# Patient Record
Sex: Male | Born: 1945 | Race: White | Hispanic: No | Marital: Married | Smoking: Never smoker
Health system: Southern US, Community
[De-identification: ages and names within clinical notes are randomized; demographics above are authoritative.]

## PROBLEM LIST (undated history)

## (undated) DIAGNOSIS — I714 Abdominal aortic aneurysm, without rupture, unspecified: Secondary | ICD-10-CM

## (undated) DIAGNOSIS — I341 Nonrheumatic mitral (valve) prolapse: Secondary | ICD-10-CM

## (undated) DIAGNOSIS — I219 Acute myocardial infarction, unspecified: Secondary | ICD-10-CM

## (undated) DIAGNOSIS — K589 Irritable bowel syndrome without diarrhea: Secondary | ICD-10-CM

## (undated) DIAGNOSIS — G4730 Sleep apnea, unspecified: Secondary | ICD-10-CM

## (undated) DIAGNOSIS — I10 Essential (primary) hypertension: Secondary | ICD-10-CM

## (undated) DIAGNOSIS — E7800 Pure hypercholesterolemia, unspecified: Secondary | ICD-10-CM

## (undated) HISTORY — PX: ANKLE SURGERY: SHX546

## (undated) HISTORY — PX: CORONARY ANGIOPLASTY WITH STENT PLACEMENT: SHX49

## (undated) HISTORY — PX: BACK SURGERY: SHX140

## (undated) HISTORY — PX: HERNIA REPAIR: SHX51

---

## 2009-05-06 ENCOUNTER — Emergency Department (HOSPITAL_BASED_OUTPATIENT_CLINIC_OR_DEPARTMENT_OTHER): Admission: EM | Admit: 2009-05-06 | Discharge: 2009-05-06 | Payer: Self-pay | Admitting: Emergency Medicine

## 2009-05-06 ENCOUNTER — Ambulatory Visit: Payer: Self-pay | Admitting: Diagnostic Radiology

## 2009-10-28 ENCOUNTER — Emergency Department (HOSPITAL_BASED_OUTPATIENT_CLINIC_OR_DEPARTMENT_OTHER): Admission: EM | Admit: 2009-10-28 | Discharge: 2009-10-28 | Payer: Self-pay | Admitting: Emergency Medicine

## 2011-03-20 LAB — URINALYSIS, ROUTINE W REFLEX MICROSCOPIC
Bilirubin Urine: NEGATIVE
Nitrite: NEGATIVE
Specific Gravity, Urine: 1.008 (ref 1.005–1.030)
Urobilinogen, UA: 1 mg/dL (ref 0.0–1.0)

## 2011-03-26 LAB — COMPREHENSIVE METABOLIC PANEL
ALT: 28 U/L (ref 0–53)
AST: 29 U/L (ref 0–37)
Calcium: 9.2 mg/dL (ref 8.4–10.5)
GFR calc Af Amer: >60 mL/min (ref >60)
Potassium: 3.7 mEq/L (ref 3.5–5.1)
Sodium: 140 mEq/L (ref 135–145)
Total Protein: 7.3 g/dL (ref 6.0–8.3)

## 2011-03-26 LAB — URINALYSIS, ROUTINE W REFLEX MICROSCOPIC
Glucose, UA: NEGATIVE mg/dL
Hgb urine dipstick: NEGATIVE
Specific Gravity, Urine: 1.005 (ref 1.005–1.030)
Urobilinogen, UA: 0.2 mg/dL (ref 0.0–1.0)
pH: 6 (ref 5.0–8.0)

## 2011-03-26 LAB — POCT CARDIAC MARKERS
Myoglobin, poc: 67.6 ng/mL (ref 12–200)
Troponin i, poc: <0.05 ng/mL (ref 0.00–0.09)

## 2011-03-26 LAB — CBC
MCHC: 33.5 g/dL (ref 30.0–36.0)
RBC: 5.61 MIL/uL (ref 4.22–5.81)
RDW: 12 % (ref 11.5–15.5)

## 2011-03-26 LAB — DIFFERENTIAL
Eosinophils Absolute: 0.2 10*3/uL (ref 0.0–0.7)
Eosinophils Relative: 4 % (ref 0–5)
Lymphs Abs: 1.8 10*3/uL (ref 0.7–4.0)
Monocytes Absolute: 0.5 10*3/uL (ref 0.1–1.0)
Monocytes Relative: 8 % (ref 3–12)

## 2012-09-23 ENCOUNTER — Ambulatory Visit (INDEPENDENT_AMBULATORY_CARE_PROVIDER_SITE_OTHER): Payer: Self-pay

## 2012-09-23 DIAGNOSIS — R197 Diarrhea, unspecified: Secondary | ICD-10-CM

## 2012-10-01 ENCOUNTER — Other Ambulatory Visit: Payer: Self-pay

## 2012-10-01 ENCOUNTER — Ambulatory Visit (INDEPENDENT_AMBULATORY_CARE_PROVIDER_SITE_OTHER): Payer: Self-pay

## 2012-10-01 DIAGNOSIS — R197 Diarrhea, unspecified: Secondary | ICD-10-CM

## 2012-10-19 ENCOUNTER — Ambulatory Visit: Payer: Self-pay

## 2012-11-30 ENCOUNTER — Ambulatory Visit (INDEPENDENT_AMBULATORY_CARE_PROVIDER_SITE_OTHER): Payer: Self-pay

## 2012-11-30 DIAGNOSIS — R197 Diarrhea, unspecified: Secondary | ICD-10-CM

## 2012-12-17 ENCOUNTER — Ambulatory Visit: Payer: Self-pay

## 2013-02-22 ENCOUNTER — Ambulatory Visit (INDEPENDENT_AMBULATORY_CARE_PROVIDER_SITE_OTHER): Payer: Self-pay

## 2013-03-09 ENCOUNTER — Telehealth: Payer: Self-pay | Admitting: Gastroenterology

## 2013-03-09 NOTE — Telephone Encounter (Signed)
Rec'd from [facility] forward 15 pages to Dr. Arlyce Dice 03/09/13

## 2013-03-10 ENCOUNTER — Ambulatory Visit (INDEPENDENT_AMBULATORY_CARE_PROVIDER_SITE_OTHER): Payer: Self-pay

## 2013-03-10 DIAGNOSIS — R197 Diarrhea, unspecified: Secondary | ICD-10-CM

## 2013-05-05 ENCOUNTER — Ambulatory Visit (INDEPENDENT_AMBULATORY_CARE_PROVIDER_SITE_OTHER): Payer: Self-pay

## 2013-05-05 DIAGNOSIS — R197 Diarrhea, unspecified: Secondary | ICD-10-CM

## 2015-12-25 ENCOUNTER — Ambulatory Visit: Payer: Self-pay | Admitting: Internal Medicine

## 2016-01-12 ENCOUNTER — Ambulatory Visit: Payer: Self-pay | Admitting: Internal Medicine

## 2018-02-15 ENCOUNTER — Other Ambulatory Visit: Payer: Self-pay

## 2018-02-15 ENCOUNTER — Encounter (HOSPITAL_BASED_OUTPATIENT_CLINIC_OR_DEPARTMENT_OTHER): Payer: Self-pay | Admitting: *Deleted

## 2018-02-15 ENCOUNTER — Emergency Department (HOSPITAL_BASED_OUTPATIENT_CLINIC_OR_DEPARTMENT_OTHER)
Admission: EM | Admit: 2018-02-15 | Discharge: 2018-02-15 | Disposition: A | Discharge status: Home / Self Care | Payer: Medicare Other | Attending: Emergency Medicine | Admitting: Emergency Medicine

## 2018-02-15 ENCOUNTER — Emergency Department (HOSPITAL_BASED_OUTPATIENT_CLINIC_OR_DEPARTMENT_OTHER): Payer: Medicare Other

## 2018-02-15 DIAGNOSIS — Z7902 Long term (current) use of antithrombotics/antiplatelets: Secondary | ICD-10-CM | POA: Diagnosis not present

## 2018-02-15 DIAGNOSIS — Z79899 Other long term (current) drug therapy: Secondary | ICD-10-CM | POA: Diagnosis not present

## 2018-02-15 DIAGNOSIS — I252 Old myocardial infarction: Secondary | ICD-10-CM | POA: Insufficient documentation

## 2018-02-15 DIAGNOSIS — R0602 Shortness of breath: Secondary | ICD-10-CM | POA: Diagnosis present

## 2018-02-15 DIAGNOSIS — I509 Heart failure, unspecified: Secondary | ICD-10-CM | POA: Diagnosis not present

## 2018-02-15 DIAGNOSIS — I110 Hypertensive heart disease with heart failure: Secondary | ICD-10-CM | POA: Insufficient documentation

## 2018-02-15 HISTORY — DX: Sleep apnea, unspecified: G47.30

## 2018-02-15 HISTORY — DX: Irritable bowel syndrome without diarrhea: K58.9

## 2018-02-15 HISTORY — DX: Abdominal aortic aneurysm, without rupture: I71.4

## 2018-02-15 HISTORY — DX: Essential (primary) hypertension: I10

## 2018-02-15 HISTORY — DX: Nonrheumatic mitral (valve) prolapse: I34.1

## 2018-02-15 HISTORY — DX: Pure hypercholesterolemia, unspecified: E78.00

## 2018-02-15 HISTORY — DX: Abdominal aortic aneurysm, without rupture, unspecified: I71.40

## 2018-02-15 HISTORY — DX: Acute myocardial infarction, unspecified: I21.9

## 2018-02-15 LAB — BASIC METABOLIC PANEL
Anion gap: 7 (ref 5–15)
BUN: 16 mg/dL (ref 6–20)
CHLORIDE: 108 mmol/L (ref 101–111)
CO2: 26 mmol/L (ref 22–32)
Calcium: 8.8 mg/dL — ABNORMAL LOW (ref 8.9–10.3)
Creatinine, Ser: 0.77 mg/dL (ref 0.61–1.24)
GFR calc Af Amer: >60 mL/min (ref >60)
Glucose, Bld: 119 mg/dL — ABNORMAL HIGH (ref 65–99)
Potassium: 4 mmol/L (ref 3.5–5.1)
Sodium: 141 mmol/L (ref 135–145)

## 2018-02-15 LAB — CBC
HEMATOCRIT: 41.7 % (ref 39.0–52.0)
HEMOGLOBIN: 14.1 g/dL (ref 13.0–17.0)
MCH: 29 pg (ref 26.0–34.0)
MCHC: 33.8 g/dL (ref 30.0–36.0)
MCV: 85.6 fL (ref 78.0–100.0)
Platelets: 209 10*3/uL (ref 150–400)
RBC: 4.87 MIL/uL (ref 4.22–5.81)
RDW: 13.8 % (ref 11.5–15.5)
WBC: 7 10*3/uL (ref 4.0–10.5)

## 2018-02-15 LAB — TROPONIN I: Troponin I: <0.03 ng/mL (ref <0.03)

## 2018-02-15 LAB — BRAIN NATRIURETIC PEPTIDE: B NATRIURETIC PEPTIDE 5: 851 pg/mL — AB (ref 0.0–100.0)

## 2018-02-15 MED ORDER — FUROSEMIDE 10 MG/ML IJ SOLN
20.0000 mg | Freq: Once | INTRAMUSCULAR | Status: AC
  Administered 2018-02-15: 20 mg via INTRAVENOUS
  Filled 2018-02-15: qty 2

## 2018-02-15 MED ORDER — FUROSEMIDE 20 MG PO TABS: 20.0000 mg | ORAL_TABLET | Freq: Every day | ORAL | 0 refills | Status: AC

## 2018-02-15 NOTE — ED Notes (Signed)
Ambulated on r/a SpO2 94-97%, HR 66-80, RR 16-22. Denies DOE

## 2018-02-15 NOTE — ED Provider Notes (Signed)
[facility] EMERGENCY DEPARTMENT Provider Note   CSN: [id number] Arrival date & time: 02/15/18  0447     History   Chief Complaint Chief Complaint  Patient presents with  . Shortness of Breath    HPI Juan Singh is a 72 y.o. male.  HPI Patient presents to the emergency room for evaluation of shortness of breath.  Patient states he has a history of coronary artery disease.  He had stents placed in December.  Ever since then he has been feeling short of breath primarily at night although it is not associated with lying flat.  Patient states he saw his doctor at some point he adjusted his blood pressure medications . he has another appointment this week.  He denies any trouble with chest pain.  No leg swelling.  No fevers.  At times he feels like he is wheezing.  He did not sleep well last night because he felt like he had to remember to breathe so he came into the emergency room to be evaluated Past Medical History:  Diagnosis Date  . AAA (abdominal aortic aneurysm) (HCC)   . High cholesterol   . Hypertension   . Irritable bowel syndrome (IBS)   . Mitral valve prolapse   . Myocardial infarction (HCC)   . Sleep apnea     There are no active problems to display for this patient.   Past Surgical History:  Procedure Laterality Date  . ANKLE SURGERY    . BACK SURGERY    . CORONARY ANGIOPLASTY WITH STENT PLACEMENT    . HERNIA REPAIR         Home Medications    Prior to Admission medications   Medication Sig Start Date End Date Taking? Authorizing Provider  atorvastatin (LIPITOR) 40 MG tablet Take 40 mg by mouth daily.   Yes [provider]  clopidogrel (PLAVIX) 75 MG tablet Take 75 mg by mouth daily.   Yes [provider]  metoprolol succinate (TOPROL-XL) 25 MG 24 hr tablet Take 25 mg by mouth daily.   Yes [provider]  nitroGLYCERIN (NITROSTAT) 0.4 MG SL tablet Place 0.4 mg under the tongue every 5 (five) minutes as needed for  chest pain.   Yes [provider]  furosemide (LASIX) 20 MG tablet Take 1 tablet (20 mg total) by mouth daily. 02/15/18   Linwood Dibbles, MD    Family History No family history on file.  Social History Social History   Tobacco Use  . Smoking status: Never Smoker  . Smokeless tobacco: Never Used  Substance Use Topics  . Alcohol use: No    Frequency: Never  . Drug use: No     Allergies   Codeine   Review of Systems Review of Systems  All other systems reviewed and are negative.    Physical Exam Updated Vital Signs BP (!) 170/103 (BP Location: Left Arm)   Pulse (!) 59   Temp 98 F (36.7 C) (Oral)   Resp 19   Ht 1.803 m (5\' 11" )   Wt 117.9 kg (260 lb)   SpO2 94%   BMI 36.26 kg/m   Physical Exam  Constitutional: He appears well-developed and well-nourished. No distress.  HENT:  Head: Normocephalic and atraumatic.  Right Ear: External ear normal.  Left Ear: External ear normal.  Eyes: Conjunctivae are normal. Right eye exhibits no discharge. Left eye exhibits no discharge. No scleral icterus.  Neck: Neck supple. No tracheal deviation present.  Cardiovascular: Normal rate, regular  rhythm and intact distal pulses.  Pulmonary/Chest: Effort normal and breath sounds normal. No stridor. No respiratory distress. He has no wheezes. He has no rales.  Breathing comfortably, speaking in full sentences  Abdominal: Soft. Bowel sounds are normal. He exhibits no distension. There is no tenderness. There is no rebound and no guarding.  Musculoskeletal: He exhibits no edema or tenderness.  Neurological: He is alert. He has normal strength. No cranial nerve deficit (no facial droop, extraocular movements intact, no slurred speech) or sensory deficit. He exhibits normal muscle tone. He displays no seizure activity. Coordination normal.  Skin: Skin is warm and dry. No rash noted.  Psychiatric: He has a normal mood and affect.  Nursing note and vitals reviewed.    ED Treatments  / Results  Labs (all labs ordered are listed, but only abnormal results are displayed) Labs Reviewed  BASIC METABOLIC PANEL - Abnormal; Notable for the following components:      Result Value   Glucose, Bld 119 (*)    Calcium 8.8 (*)    All other components within normal limits  BRAIN NATRIURETIC PEPTIDE - Abnormal; Notable for the following components:   B Natriuretic Peptide 851.0 (*)    All other components within normal limits  CBC  TROPONIN I    EKG  EKG Interpretation  Date/Time:  Sunday February 15 2018 05:12:59 EST Ventricular Rate:  60 PR Interval:    QRS Duration: 125 QT Interval:  435 QTC Calculation: 435 R Axis:   68 Text Interpretation:  Sinus rhythm Nonspecific intraventricular conduction delay Anterior infarct, old r wave progression normalized since last tracing Confirmed by Linwood Dibbles [phone]) on 02/15/2018 5:29:01 AM       Radiology Dg Chest 2 View  Result Date: 02/15/2018 CLINICAL DATA:  Shortness of breath. EXAM: CHEST  2 VIEW COMPARISON:  Radiographs and CT 12/06/2017 FINDINGS: The heart is enlarged, similar to prior exam. Pulmonary edema is new. Small bilateral pleural effusions and fluid in the fissures. Streaky bibasilar opacities may be atelectasis or basilar pulmonary edema. IMPRESSION: CHF with cardiomegaly, pulmonary edema and small pleural effusions. Streaky infrahilar opacities may be more confluent pulmonary edema or atelectasis. Electronically Signed   By: Rubye Oaks M.D.   On: 02/15/2018 05:45    Procedures Procedures (including critical care time)  Medications Ordered in ED Medications  furosemide (LASIX) injection 20 mg (not administered)     Initial Impression / Assessment and Plan / ED Course  I have reviewed the triage vital signs and the nursing notes.  Pertinent labs & imaging results that were available during my care of the patient were reviewed by me and considered in my medical decision making (see chart for  details).  Clinical Course as of Feb 15 622  Wynelle Link Feb 15, 2018  0621 Laboratory tests are notable for an elevated BNP.  Chest x-ray suggests pulmonary edema and cardiomegaly  [JK]    Clinical Course User Index [JK] Linwood Dibbles, MD  Patient presents to the emergency room for evaluation of several months of shortness of breath following a myocardial infarction.  Patient is ED workup suggests congestive heart failure.  Patient is breathing easily in the emergency room.  He is got a normal oxygen saturation.  I think we could consider outpatient treatment considering the chronicity.  I will give the patient a dose of Lasix here in the emergency room and see if he notices any improvement.  We will try walking around and make sure he feels stable  for discharge.  Patient will need close follow-up with his cardiologist earlier this week.  Final Clinical Impressions(s) / ED Diagnoses   Final diagnoses:  Acute congestive heart failure, unspecified heart failure type [facility](HCC)    ED Discharge Orders        Ordered    furosemide (LASIX) 20 MG tablet  Daily     02/15/18 0620       Linwood DibblesKnapp, Eri Platten, MD 02/15/18 [phone]

## 2018-02-15 NOTE — ED Notes (Signed)
To x-ray

## 2018-02-15 NOTE — ED Notes (Signed)
Pt on monitor 

## 2018-02-15 NOTE — ED Notes (Signed)
ED Provider at bedside. 

## 2018-02-15 NOTE — ED Notes (Signed)
Returned from xray

## 2018-02-15 NOTE — ED Provider Notes (Signed)
PT feeling much better after diuresing after lasix.  Denies any CP or SOB.  Able to ambulate without SOB or hypoxia.  Will have close f/u with his cardiologist.  Given strict return precautions.  rx and discharge papers given per Dr. Lynelle DoctorKnapp.   Rolan BuccoBelfi, Marquesa Rath, MD 02/15/18 [phone]

## 2018-02-15 NOTE — ED Notes (Signed)
MD with pt  

## 2018-02-15 NOTE — ED Triage Notes (Addendum)
C/o feeling sob that started a couple months ago after he had stents placed but worse since last night. Has been congested. Denies any fevers. Denies any chest pain. Denies any n/v. resp even and unlabored. No distress. Lungs clear anterior. 02sats 94-96% on RA MD aware.

## 2018-02-15 NOTE — Discharge Instructions (Addendum)
Call your cardiologist to be seen early this week.   Take the diuretic medication in the morning.   Return to the ED if your symptoms are worsening

## 2019-05-10 IMAGING — DX DG CHEST 2V
2 series · 2 of 2 positions shown · non-contrast
Comparison: Radiographs and CT 12/06/2017

CLINICAL DATA: Shortness of breath.

EXAM:
CHEST  2 VIEW

[chest pa]
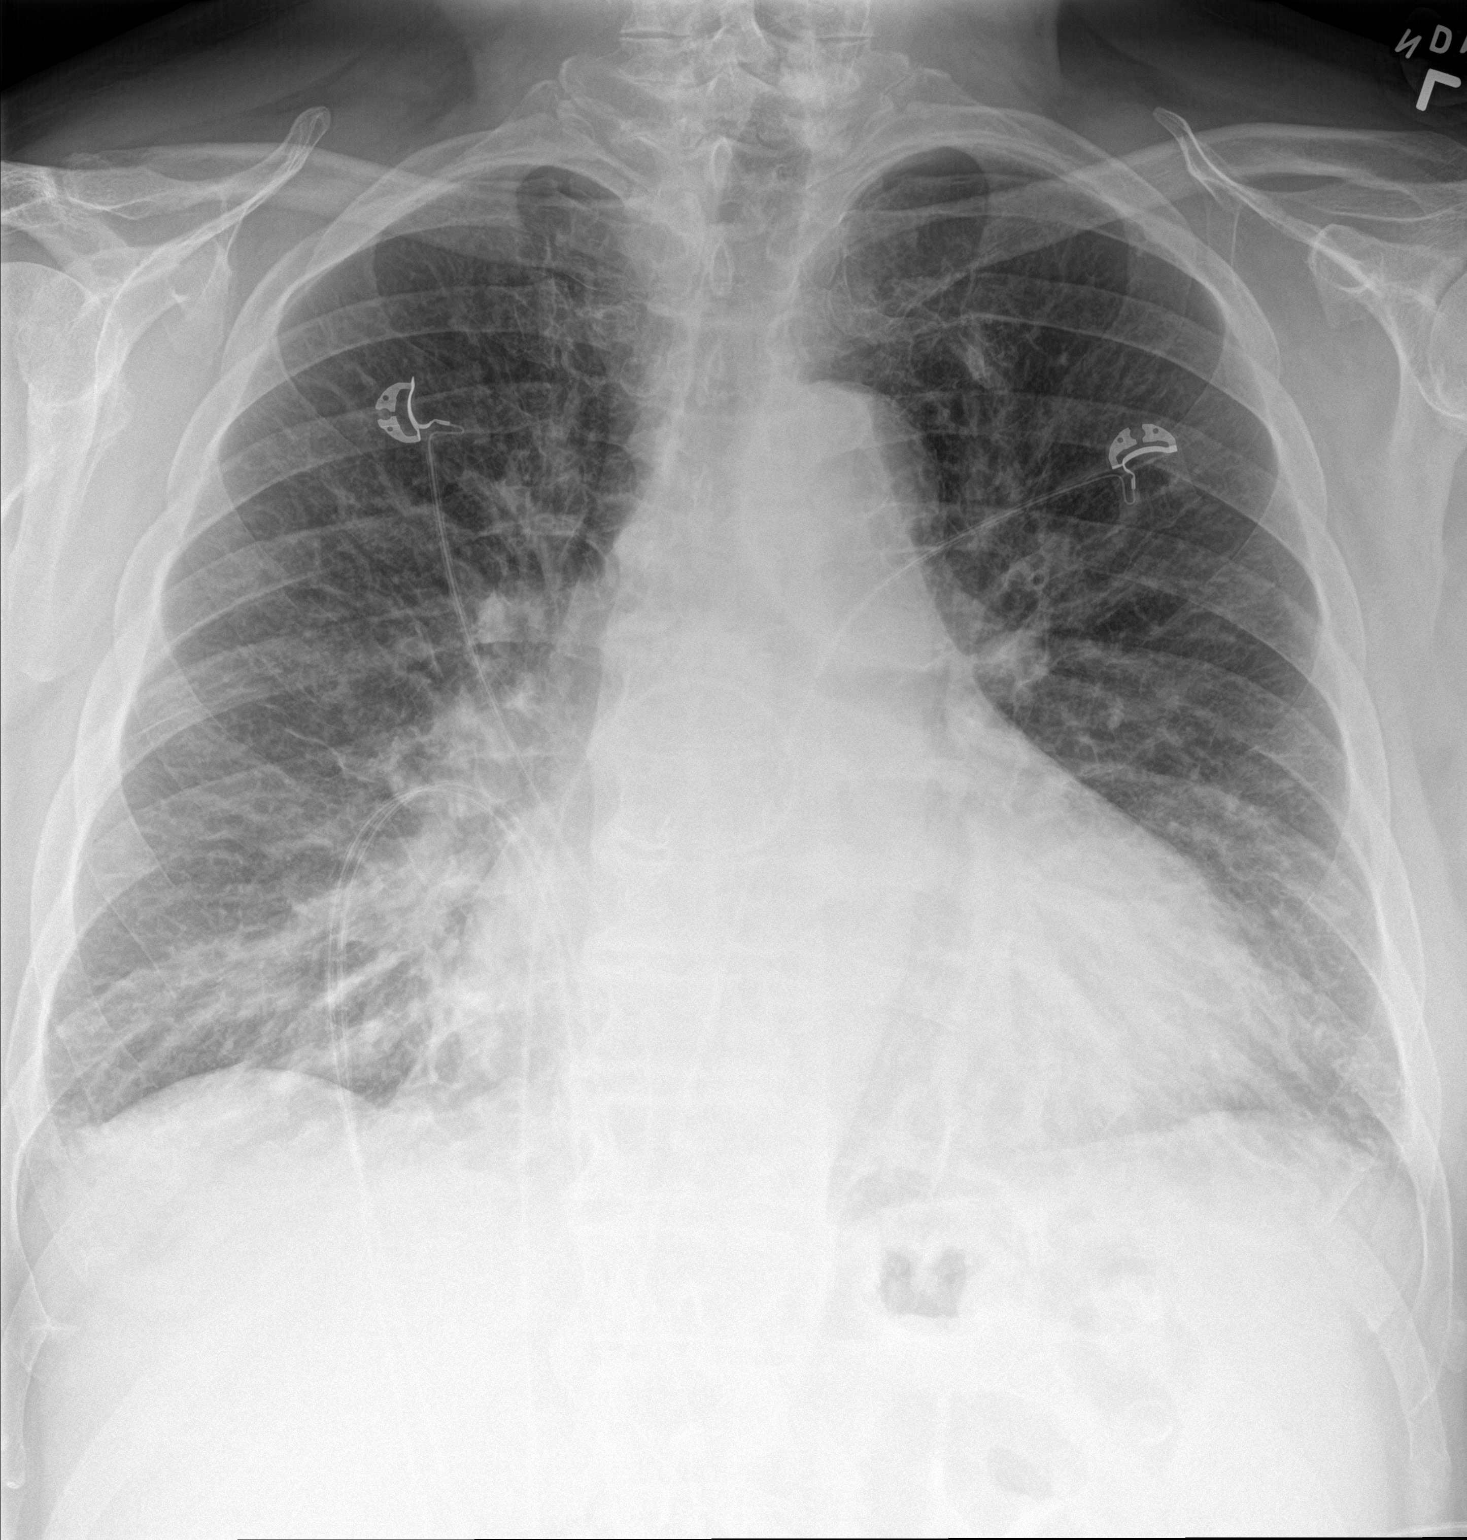

[chest lat]
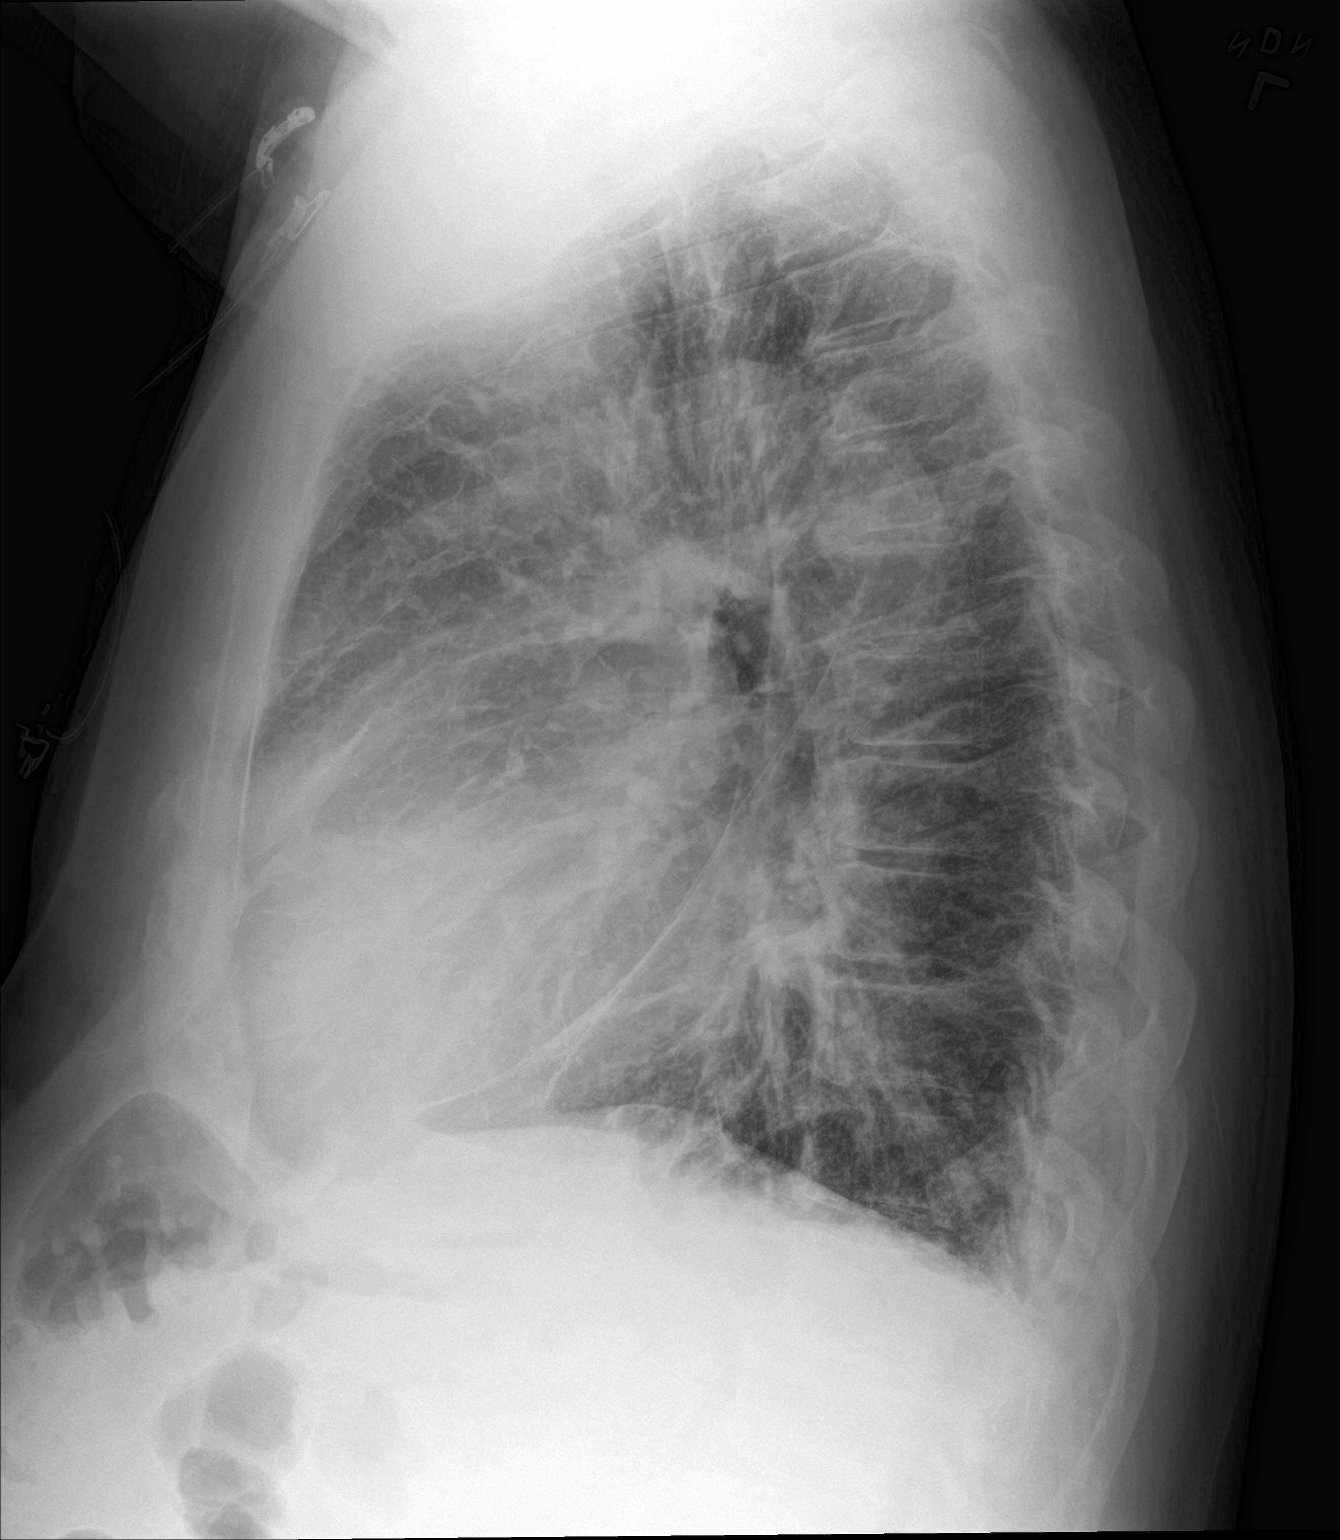

[2 of 2 positions shown; findings below may reference images not displayed]

FINDINGS: The heart is enlarged, similar to prior exam. Pulmonary edema is
new. Small bilateral pleural effusions and fluid in the fissures.
Streaky bibasilar opacities may be atelectasis or basilar pulmonary
edema.
IMPRESSION: CHF with cardiomegaly, pulmonary edema and small pleural effusions.
Streaky infrahilar opacities may be more confluent pulmonary edema
or atelectasis.
